# Patient Record
Sex: Male | Born: 1990 | Race: Black or African American | Hispanic: No | Marital: Single | Smoking: Current every day smoker
Health system: Southern US, Community
[De-identification: ages and names within clinical notes are randomized; demographics above are authoritative.]

## PROBLEM LIST (undated history)

## (undated) DIAGNOSIS — B009 Herpesviral infection, unspecified: Secondary | ICD-10-CM

## (undated) HISTORY — PX: TONSILLECTOMY: SUR1361

---

## 2010-12-16 ENCOUNTER — Emergency Department (HOSPITAL_COMMUNITY): Payer: Medicaid Other

## 2010-12-16 ENCOUNTER — Emergency Department (HOSPITAL_COMMUNITY)
Admission: EM | Admit: 2010-12-16 | Discharge: 2010-12-17 | Disposition: A | Discharge status: Home / Self Care | Payer: Medicaid Other | Attending: Emergency Medicine | Admitting: Emergency Medicine

## 2010-12-16 DIAGNOSIS — Y9367 Activity, basketball: Secondary | ICD-10-CM | POA: Insufficient documentation

## 2010-12-16 DIAGNOSIS — S93409A Sprain of unspecified ligament of unspecified ankle, initial encounter: Secondary | ICD-10-CM | POA: Insufficient documentation

## 2010-12-16 DIAGNOSIS — X500XXA Overexertion from strenuous movement or load, initial encounter: Secondary | ICD-10-CM | POA: Insufficient documentation

## 2011-12-12 HISTORY — PX: APPENDECTOMY: SHX54

## 2012-01-11 ENCOUNTER — Encounter (HOSPITAL_COMMUNITY): Payer: Self-pay | Admitting: Emergency Medicine

## 2012-01-11 ENCOUNTER — Observation Stay (HOSPITAL_COMMUNITY)
Admission: EM | Admit: 2012-01-11 | Discharge: 2012-01-12 | Disposition: A | Discharge status: Home / Self Care | Payer: MEDICAID | Attending: General Surgery | Admitting: General Surgery

## 2012-01-11 DIAGNOSIS — K3580 Unspecified acute appendicitis: Secondary | ICD-10-CM

## 2012-01-11 DIAGNOSIS — R1031 Right lower quadrant pain: Principal | ICD-10-CM | POA: Insufficient documentation

## 2012-01-11 LAB — CBC WITH DIFFERENTIAL/PLATELET
Basophils Relative: 0 % (ref 0–1)
Hemoglobin: 16.4 g/dL (ref 13.0–17.0)
MCHC: 35 g/dL (ref 30.0–36.0)
Monocytes Relative: 8 % (ref 3–12)
Neutro Abs: 7.8 10*3/uL — ABNORMAL HIGH (ref 1.7–7.7)
Neutrophils Relative %: 68 % (ref 43–77)
Platelets: 299 10*3/uL (ref 150–400)
RBC: 5.38 MIL/uL (ref 4.22–5.81)

## 2012-01-11 LAB — URINALYSIS, ROUTINE W REFLEX MICROSCOPIC
Leukocytes, UA: NEGATIVE
Nitrite: NEGATIVE
Specific Gravity, Urine: 1.026 (ref 1.005–1.030)
Urobilinogen, UA: 1 mg/dL (ref 0.0–1.0)
pH: 7 (ref 5.0–8.0)

## 2012-01-11 LAB — BASIC METABOLIC PANEL
BUN: 11 mg/dL (ref 6–23)
Chloride: 101 mEq/L (ref 96–112)
GFR calc Af Amer: >90 mL/min (ref >90)
Potassium: 3.4 mEq/L — ABNORMAL LOW (ref 3.5–5.1)
Sodium: 142 mEq/L (ref 135–145)

## 2012-01-11 MED ORDER — MORPHINE SULFATE 4 MG/ML IJ SOLN
4.0000 mg | Freq: Once | INTRAMUSCULAR | Status: AC
  Administered 2012-01-11: 4 mg via INTRAVENOUS
  Filled 2012-01-11: qty 1

## 2012-01-11 MED ORDER — IOHEXOL 300 MG/ML  SOLN
20.0000 mL | INTRAMUSCULAR | Status: DC
  Administered 2012-01-11: 20 mL via ORAL

## 2012-01-11 MED ORDER — ONDANSETRON HCL 4 MG/2ML IJ SOLN
4.0000 mg | Freq: Once | INTRAMUSCULAR | Status: AC
  Administered 2012-01-11: 4 mg via INTRAVENOUS
  Filled 2012-01-11: qty 2

## 2012-01-11 NOTE — ED Notes (Signed)
Pt drinking contrast no complaints at this time

## 2012-01-11 NOTE — ED Notes (Signed)
PT. REPORTS VOMITTING AND DIARRHEA WITH MID ABDOMINAL PAIN ONSETT HIS MORNING , DENIES FEVER OR CHILLS.

## 2012-01-11 NOTE — ED Provider Notes (Signed)
History     CSN: [id number]  Arrival date & time 01/11/12  4098   First MD Initiated Contact with Patient 01/11/12 2129      Chief Complaint  Patient presents with  . Abdominal Pain    (Consider location/radiation/quality/duration/timing/severity/associated sxs/prior treatment) HPI Comments: Patient presents today with a chief complaint of abdominal pain.  He reports that he began having periumbilical pain this morning.  The pain has since migrated to the RLQ and is becoming progressively worse.  He describes the pain as an intense cramping pain.  He reports that the pain became worse when he was wearing his seatbelt because it touched his RLQ.  Walking also makes the pain slightly worse.  He has not taken anything for pain.  At this time he is having localized pain to the RLQ.  Pain associated with nausea, vomiting, and diarrhea.  No blood in his emesis or blood in his stool.  He denies fever or chills.  He has also had a decreased appetite.  No prior abdominal surgeries.  He has never had pain like this before.    Patient is a 21 y.o. male presenting with abdominal pain. The history is provided by the patient.  Abdominal Pain The primary symptoms of the illness include abdominal pain, nausea, vomiting and diarrhea. The primary symptoms of the illness do not include fever, shortness of breath, hematemesis, hematochezia or dysuria.  Symptoms associated with the illness do not include chills, constipation or hematuria.    History reviewed. No pertinent past medical history.  History reviewed. No pertinent past surgical history.  No family history on file.  History  Substance Use Topics  . Smoking status: Never Smoker   . Smokeless tobacco: Not on file  . Alcohol Use: Yes      Review of Systems  Constitutional: Positive for appetite change. Negative for fever and chills.  Respiratory: Negative for shortness of breath.   Gastrointestinal: Positive for nausea, vomiting, abdominal  pain and diarrhea. Negative for constipation, blood in stool, hematochezia, abdominal distention and hematemesis.  Genitourinary: Negative for dysuria, hematuria, flank pain, scrotal swelling and penile pain.  Neurological: Negative for dizziness, syncope and light-headedness.    Allergies  Review of patient's allergies indicates no known allergies.  Home Medications  No current outpatient prescriptions on file.  BP 128/84  Pulse 77  Temp 98.3 F (36.8 C) (Oral)  Resp 16  SpO2 100%  Physical Exam  Nursing note and vitals reviewed. Constitutional: He appears well-developed and well-nourished. No distress.  HENT:  Head: Normocephalic and atraumatic.  Mouth/Throat: Oropharynx is clear and moist.  Neck: Normal range of motion. Neck supple.  Cardiovascular: Normal rate, regular rhythm and normal heart sounds.   Pulmonary/Chest: Effort normal and breath sounds normal.  Abdominal: Soft. Bowel sounds are normal. He exhibits no distension and no mass. There is tenderness. There is rebound and guarding.       Tenderness to palpation of the RLQ with rebound and guarding Negative Rovsing's Negative Obturator and Psoas  Musculoskeletal: Normal range of motion.  Neurological: He is alert.  Skin: Skin is warm and dry. He is not diaphoretic.  Psychiatric: He has a normal mood and affect.    ED Course  Procedures (including critical care time)  Labs Reviewed  CBC WITH DIFFERENTIAL - Abnormal; Notable for the following:    WBC 11.5 (*)     Neutro Abs 7.8 (*)     All other components within normal limits  BASIC METABOLIC PANEL -  Abnormal; Notable for the following:    Potassium 3.4 (*)     All other components within normal limits  URINALYSIS, ROUTINE W REFLEX MICROSCOPIC - Abnormal; Notable for the following:    Color, Urine AMBER (*)  BIOCHEMICALS MAY BE AFFECTED BY COLOR   Ketones, ur 40 (*)     All other components within normal limits   No results found.   No diagnosis  found.  12:30  Patient signed out to Dr. Jeraldine Loots who assumes care of patient.  CT ab/pelvis pending.  MDM  Patient presenting with periumbilical pain that had migrated to his RLQ.    Pain localized to RLQ on exam with rebound and guarding.  Pain associated with nausea, vomiting, and diarrhea.  No fevers or chills.  WBC mildly elevated.  Therefore, CT ab/pelvis ordered to rule out Acute Appendicitis.  Dr. Jeraldine Loots will follow up on results.        Pascal Lux [place], PA-C 01/12/12 0122

## 2012-01-12 ENCOUNTER — Emergency Department (HOSPITAL_COMMUNITY): Payer: Self-pay | Admitting: Certified Registered Nurse Anesthetist

## 2012-01-12 ENCOUNTER — Encounter (HOSPITAL_COMMUNITY): Admission: EM | Disposition: A | Discharge status: Home / Self Care | Payer: Self-pay | Source: Home / Self Care

## 2012-01-12 ENCOUNTER — Encounter (HOSPITAL_COMMUNITY): Payer: Self-pay | Admitting: *Deleted

## 2012-01-12 ENCOUNTER — Emergency Department (HOSPITAL_COMMUNITY): Payer: Self-pay

## 2012-01-12 ENCOUNTER — Encounter (HOSPITAL_COMMUNITY): Payer: Self-pay | Admitting: Certified Registered Nurse Anesthetist

## 2012-01-12 DIAGNOSIS — K3580 Unspecified acute appendicitis: Secondary | ICD-10-CM

## 2012-01-12 HISTORY — PX: LAPAROSCOPIC APPENDECTOMY: SHX408

## 2012-01-12 SURGERY — APPENDECTOMY, LAPAROSCOPIC
Anesthesia: General | Site: Abdomen | Wound class: Contaminated

## 2012-01-12 MED ORDER — IOHEXOL 300 MG/ML  SOLN
100.0000 mL | Freq: Once | INTRAMUSCULAR | Status: AC | PRN
  Administered 2012-01-12: 100 mL via INTRAVENOUS

## 2012-01-12 MED ORDER — GLYCOPYRROLATE 0.2 MG/ML IJ SOLN
INTRAMUSCULAR | Status: DC | PRN
  Administered 2012-01-12: .7 mg via INTRAVENOUS

## 2012-01-12 MED ORDER — ONDANSETRON HCL 4 MG/2ML IJ SOLN: 4.0000 mg | Freq: Four times a day (QID) | INTRAMUSCULAR | Status: DC | PRN

## 2012-01-12 MED ORDER — MEPERIDINE HCL 25 MG/ML IJ SOLN: 6.2500 mg | INTRAMUSCULAR | Status: DC | PRN

## 2012-01-12 MED ORDER — PROMETHAZINE HCL 25 MG/ML IJ SOLN: 6.2500 mg | INTRAMUSCULAR | Status: DC | PRN

## 2012-01-12 MED ORDER — ENOXAPARIN SODIUM 40 MG/0.4ML ~~LOC~~ SOLN
40.0000 mg | Freq: Every day | SUBCUTANEOUS | Status: DC
  Filled 2012-01-12: qty 0.4

## 2012-01-12 MED ORDER — ACETAMINOPHEN 10 MG/ML IV SOLN
INTRAVENOUS | Status: DC | PRN
  Administered 2012-01-12: 1000 mg via INTRAVENOUS

## 2012-01-12 MED ORDER — PROPOFOL 10 MG/ML IV EMUL
INTRAVENOUS | Status: DC | PRN
  Administered 2012-01-12: 200 mg via INTRAVENOUS

## 2012-01-12 MED ORDER — ONDANSETRON HCL 4 MG PO TABS: 4.0000 mg | ORAL_TABLET | Freq: Four times a day (QID) | ORAL | Status: DC | PRN

## 2012-01-12 MED ORDER — BUPIVACAINE-EPINEPHRINE 0.5% -1:200000 IJ SOLN
INTRAMUSCULAR | Status: DC | PRN
  Administered 2012-01-12: 30 mL

## 2012-01-12 MED ORDER — OXYCODONE-ACETAMINOPHEN 5-325 MG PO TABS
1.0000 | ORAL_TABLET | ORAL | Status: DC | PRN
Start: 2012-01-12 — End: 2012-01-12

## 2012-01-12 MED ORDER — LIDOCAINE HCL (CARDIAC) 20 MG/ML IV SOLN
INTRAVENOUS | Status: DC | PRN
  Administered 2012-01-12: 80 mg via INTRAVENOUS

## 2012-01-12 MED ORDER — LACTATED RINGERS IV SOLN
INTRAVENOUS | Status: DC | PRN
  Administered 2012-01-12 (×2): via INTRAVENOUS

## 2012-01-12 MED ORDER — HYDROMORPHONE HCL PF 1 MG/ML IJ SOLN: 0.2500 mg | INTRAMUSCULAR | Status: DC | PRN

## 2012-01-12 MED ORDER — DEXTROSE 5 % IV SOLN
2.0000 g | INTRAVENOUS | Status: DC
  Filled 2012-01-12: qty 2

## 2012-01-12 MED ORDER — POTASSIUM CHLORIDE IN NACL 20-0.9 MEQ/L-% IV SOLN
INTRAVENOUS | Status: DC
  Administered 2012-01-12: 06:00:00 via INTRAVENOUS
  Filled 2012-01-12 (×2): qty 1000

## 2012-01-12 MED ORDER — ONDANSETRON HCL 4 MG/2ML IJ SOLN
INTRAMUSCULAR | Status: DC | PRN
  Administered 2012-01-12: 100 mg via INTRAVENOUS

## 2012-01-12 MED ORDER — SODIUM CHLORIDE 0.9 % IR SOLN
Status: DC | PRN
  Administered 2012-01-12: 1

## 2012-01-12 MED ORDER — ROCURONIUM BROMIDE 100 MG/10ML IV SOLN
INTRAVENOUS | Status: DC | PRN
  Administered 2012-01-12: 25 mg via INTRAVENOUS

## 2012-01-12 MED ORDER — OXYCODONE-ACETAMINOPHEN 5-325 MG PO TABS: 1.0000 | ORAL_TABLET | ORAL | Status: AC | PRN

## 2012-01-12 MED ORDER — HYDROMORPHONE HCL PF 1 MG/ML IJ SOLN: 1.0000 mg | INTRAMUSCULAR | Status: DC | PRN

## 2012-01-12 MED ORDER — KETOROLAC TROMETHAMINE 30 MG/ML IM SOLN
INTRAMUSCULAR | Status: DC | PRN
  Administered 2012-01-12: 30 mg via INTRAMUSCULAR

## 2012-01-12 MED ORDER — MIDAZOLAM HCL 5 MG/5ML IJ SOLN
INTRAMUSCULAR | Status: DC | PRN
  Administered 2012-01-12: 2 mg via INTRAVENOUS

## 2012-01-12 MED ORDER — NEOSTIGMINE METHYLSULFATE 1 MG/ML IJ SOLN
INTRAMUSCULAR | Status: DC | PRN
  Administered 2012-01-12: 4 mg via INTRAVENOUS

## 2012-01-12 MED ORDER — FENTANYL CITRATE 0.05 MG/ML IJ SOLN
INTRAMUSCULAR | Status: DC | PRN
  Administered 2012-01-12: 50 ug via INTRAVENOUS
  Administered 2012-01-12: 100 ug via INTRAVENOUS
  Administered 2012-01-12 (×2): 50 ug via INTRAVENOUS

## 2012-01-12 MED ORDER — DEXTROSE 5 % IV SOLN
1.0000 g | INTRAVENOUS | Status: DC | PRN
  Administered 2012-01-12: 2 g via INTRAVENOUS

## 2012-01-12 MED ORDER — IBUPROFEN 600 MG PO TABS
600.0000 mg | ORAL_TABLET | Freq: Four times a day (QID) | ORAL | Status: DC | PRN
  Filled 2012-01-12: qty 1

## 2012-01-12 SURGICAL SUPPLY — 41 items
APPLIER CLIP LOGIC TI 5 (MISCELLANEOUS) IMPLANT
APPLIER CLIP ROT 10 11.4 M/L (STAPLE)
BANDAGE ADHESIVE 1X3 (GAUZE/BANDAGES/DRESSINGS) ×2 IMPLANT
BENZOIN TINCTURE PRP APPL 2/3 (GAUZE/BANDAGES/DRESSINGS) ×2 IMPLANT
CANISTER SUCTION 2500CC (MISCELLANEOUS) ×2 IMPLANT
CHLORAPREP W/TINT 26ML (MISCELLANEOUS) ×2 IMPLANT
CLIP APPLIE ROT 10 11.4 M/L (STAPLE) IMPLANT
CLOTH BEACON ORANGE TIMEOUT ST (SAFETY) ×2 IMPLANT
COVER SURGICAL LIGHT HANDLE (MISCELLANEOUS) ×2 IMPLANT
CUTTER LINEAR ENDO 35 ETS (STAPLE) ×2 IMPLANT
CUTTER LINEAR ENDO 35 ETS TH (STAPLE) IMPLANT
DECANTER SPIKE VIAL GLASS SM (MISCELLANEOUS) IMPLANT
ELECT REM PT RETURN 9FT ADLT (ELECTROSURGICAL) ×2
ELECTRODE REM PT RTRN 9FT ADLT (ELECTROSURGICAL) ×1 IMPLANT
ENDOLOOP SUT PDS II  0 18 (SUTURE)
ENDOLOOP SUT PDS II 0 18 (SUTURE) IMPLANT
GLOVE BIO SURGEON STRL SZ7.5 (GLOVE) ×2 IMPLANT
GLOVE BIOGEL PI IND STRL 7.5 (GLOVE) ×1 IMPLANT
GLOVE BIOGEL PI INDICATOR 7.5 (GLOVE) ×1
GLOVE SURG SIGNA 7.5 PF LTX (GLOVE) ×2 IMPLANT
GLOVE SURG SS PI 7.5 STRL IVOR (GLOVE) ×2 IMPLANT
GOWN PREVENTION PLUS XLARGE (GOWN DISPOSABLE) ×2 IMPLANT
GOWN STRL NON-REIN LRG LVL3 (GOWN DISPOSABLE) ×2 IMPLANT
KIT BASIN OR (CUSTOM PROCEDURE TRAY) ×2 IMPLANT
KIT ROOM TURNOVER OR (KITS) ×2 IMPLANT
NS IRRIG 1000ML POUR BTL (IV SOLUTION) ×2 IMPLANT
PAD ARMBOARD 7.5X6 YLW CONV (MISCELLANEOUS) ×4 IMPLANT
POUCH SPECIMEN RETRIEVAL 10MM (ENDOMECHANICALS) ×2 IMPLANT
RELOAD /EVU35 (ENDOMECHANICALS) IMPLANT
RELOAD CUTTER ETS 35MM STAND (ENDOMECHANICALS) IMPLANT
SCALPEL HARMONIC ACE (MISCELLANEOUS) ×2 IMPLANT
SET IRRIG TUBING LAPAROSCOPIC (IRRIGATION / IRRIGATOR) ×2 IMPLANT
SLEEVE ENDOPATH XCEL 5M (ENDOMECHANICALS) ×2 IMPLANT
SPECIMEN JAR SMALL (MISCELLANEOUS) ×2 IMPLANT
SUT MON AB 4-0 PC3 18 (SUTURE) ×2 IMPLANT
TOWEL OR 17X24 6PK STRL BLUE (TOWEL DISPOSABLE) ×2 IMPLANT
TOWEL OR 17X26 10 PK STRL BLUE (TOWEL DISPOSABLE) ×2 IMPLANT
TRAY LAPAROSCOPIC (CUSTOM PROCEDURE TRAY) ×2 IMPLANT
TROCAR XCEL BLUNT TIP 100MML (ENDOMECHANICALS) ×2 IMPLANT
TROCAR XCEL NON-BLD 5MMX100MML (ENDOMECHANICALS) ×2 IMPLANT
WATER STERILE IRR 1000ML POUR (IV SOLUTION) IMPLANT

## 2012-01-12 NOTE — ED Notes (Signed)
Pt to or from [facility]

## 2012-01-12 NOTE — Anesthesia Preprocedure Evaluation (Addendum)
Anesthesia Evaluation  Patient identified by MRN, date of birth, ID band Patient awake    Reviewed: Allergy & Precautions, H&P , NPO status   Airway Mallampati: I  Neck ROM: full    Dental  (+) Dental Advisory Given and Teeth Intact   Pulmonary neg pulmonary ROS, Current Smoker,          Cardiovascular negative cardio ROS  Rhythm:regular Rate:Normal     Neuro/Psych    GI/Hepatic negative GI ROS, Neg liver ROS,   Endo/Other  negative endocrine ROS  Renal/GU negative Renal ROS     Musculoskeletal   Abdominal   Peds  Hematology negative hematology ROS (+)   Anesthesia Other Findings   Reproductive/Obstetrics                         Anesthesia Physical Anesthesia Plan  ASA: I and Emergent  Anesthesia Plan: General ETT   Post-op Pain Management:    Induction: Intravenous, Rapid sequence and Cricoid pressure planned  Airway Management Planned: Oral ETT  Additional Equipment:   Intra-op Plan:   Post-operative Plan: Extubation in OR  Informed Consent: I have reviewed the patients History and Physical, chart, labs and discussed the procedure including the risks, benefits and alternatives for the proposed anesthesia with the patient or authorized representative who has indicated his/her understanding and acceptance.   Dental Advisory Given and Dental advisory given  Plan Discussed with: Surgeon  Anesthesia Plan Comments:        Anesthesia Quick Evaluation

## 2012-01-12 NOTE — ED Notes (Signed)
Pt ambulates to bathroom

## 2012-01-12 NOTE — H&P (Signed)
Larry Harper is an 21 y.o. male.   Chief Complaint: right lower quadrant abdominal pain HPI: this is a 21 year old gentleman with a less than 24-hour history of right lower quadrant abdominal pain. He had one episode of emesis with this. He has also had some nausea. The pain is sharp, constant, and moderate in intensity. It does not refer anywhere else. He denies fevers or chills. He is otherwise without complaints.  History reviewed. No pertinent past medical history.  Past surgical history: Tonsillectomy Family history: Unremarkable Social History:  reports that he has never smoked. He does not have any smokeless tobacco history on file. He reports that he drinks alcohol. He reports that he does not use illicit drugs.  Allergies: No Known Allergies   (Not in a hospital admission)  Results for orders placed during the hospital encounter of 01/11/12 (from the past 48 hour(s))  CBC WITH DIFFERENTIAL     Status: Abnormal   Collection Time   01/11/12  7:48 PM      Component Value Range Comment   WBC 11.5 (*) 4.0 - 10.5 K/uL    RBC 5.38  4.22 - 5.81 MIL/uL    Hemoglobin 16.4  13.0 - 17.0 g/dL    HCT 40.9  81.1 - 91.4 %    MCV 87.0  78.0 - 100.0 fL    MCH 30.5  26.0 - 34.0 pg    MCHC 35.0  30.0 - 36.0 g/dL    RDW 78.2  95.6 - 21.3 %    Platelets 299  150 - 400 K/uL    Neutrophils Relative 68  43 - 77 %    Neutro Abs 7.8 (*) 1.7 - 7.7 K/uL    Lymphocytes Relative 23  12 - 46 %    Lymphs Abs 2.7  0.7 - 4.0 K/uL    Monocytes Relative 8  3 - 12 %    Monocytes Absolute 0.9  0.1 - 1.0 K/uL    Eosinophils Relative 1  0 - 5 %    Eosinophils Absolute 0.1  0.0 - 0.7 K/uL    Basophils Relative 0  0 - 1 %    Basophils Absolute 0.0  0.0 - 0.1 K/uL   BASIC METABOLIC PANEL     Status: Abnormal   Collection Time   01/11/12  7:48 PM      Component Value Range Comment   Sodium 142  135 - 145 mEq/L    Potassium 3.4 (*) 3.5 - 5.1 mEq/L    Chloride 101  96 - 112 mEq/L    CO2 30  19 - 32 mEq/L    Glucose, Bld 84  70 - 99 mg/dL    BUN 11  6 - 23 mg/dL    Creatinine, Ser 0.86  0.50 - 1.35 mg/dL    Calcium 9.5  8.4 - 57.8 mg/dL    GFR calc non Af Amer >90  >90 mL/min    GFR calc Af Amer >90  >90 mL/min   URINALYSIS, ROUTINE W REFLEX MICROSCOPIC     Status: Abnormal   Collection Time   01/11/12  7:51 PM      Component Value Range Comment   Color, Urine AMBER (*) YELLOW BIOCHEMICALS MAY BE AFFECTED BY COLOR   APPearance CLEAR  CLEAR    Specific Gravity, Urine 1.026  1.005 - 1.030    pH 7.0  5.0 - 8.0    Glucose, UA NEGATIVE  NEGATIVE mg/dL    Hgb urine dipstick NEGATIVE  NEGATIVE    Bilirubin Urine NEGATIVE  NEGATIVE    Ketones, ur 40 (*) NEGATIVE mg/dL    Protein, ur NEGATIVE  NEGATIVE mg/dL    Urobilinogen, UA 1.0  0.0 - 1.0 mg/dL    Nitrite NEGATIVE  NEGATIVE    Leukocytes, UA NEGATIVE  NEGATIVE MICROSCOPIC NOT DONE ON URINES WITH NEGATIVE PROTEIN, BLOOD, LEUKOCYTES, NITRITE, OR GLUCOSE <1000 mg/dL.   Ct Abdomen Pelvis W Contrast  01/12/2012  *RADIOLOGY REPORT*  Clinical Data: Right-sided abdominal pain  CT ABDOMEN AND PELVIS WITH CONTRAST  Technique:  Multidetector CT imaging of the abdomen and pelvis was performed following the standard protocol during bolus administration of intravenous contrast.  Contrast: OMNIPAQUE IOHEXOL 300 MG/ML  SOLN  Comparison: None.  Findings: Minimal subsegmental dependent atelectasis.  Otherwise lung bases are clear.  Normal heart size.  No pleural or pericardial effusion.  Unremarkable liver, biliary system, spleen, pancreas, adrenal glands, and kidneys.  No hydronephrosis or hydroureter.  The appendix measures 9 mm in diameter.  There is mucosal hyperenhancement and periappendiceal fat stranding.  No free intraperitoneal air or loculated fluid.  There is a small amount of dependent fluid within the pelvis.  No bowel obstruction.  No CT evidence for colitis.  No lymphadenopathy.  Reactive ileocolic lymph nodes.  Normal caliber vasculature.  Partially  decompressed, thin-walled bladder.  No acute osseous finding.  IMPRESSION: Acute appendicitis.  Original Report Authenticated By: Waneta Martins, M.D.    Review of Systems  Constitutional: Negative.   HENT: Negative.   Eyes: Negative.   Respiratory: Negative.   Cardiovascular: Negative.   Gastrointestinal: Positive for nausea, vomiting and abdominal pain.  Genitourinary: Negative.   Musculoskeletal: Negative.   Skin: Negative.   Neurological: Negative.   Endo/Heme/Allergies: Negative.   Psychiatric/Behavioral: Negative.     Blood pressure 121/83, pulse 70, temperature 98.3 F (36.8 C), temperature source Oral, resp. rate 16, SpO2 98.00%. Physical Exam  Constitutional: He is oriented to person, place, and time. He appears well-developed and well-nourished. No distress.  HENT:  Head: Normocephalic and atraumatic.  Right Ear: External ear normal.  Left Ear: External ear normal.  Nose: Nose normal.  Mouth/Throat: Oropharynx is clear and moist. No oropharyngeal exudate.  Eyes: Conjunctivae are normal. Pupils are equal, round, and reactive to light. Right eye exhibits no discharge. Left eye exhibits no discharge. No scleral icterus.  Neck: Normal range of motion. Neck supple. No tracheal deviation present. No thyromegaly present.  Cardiovascular: Normal rate, regular rhythm, normal heart sounds and intact distal pulses.   No murmur heard. Respiratory: Effort normal and breath sounds normal. No respiratory distress. He has no wheezes.  GI: Soft. Bowel sounds are normal. There is tenderness. There is guarding.       There is mild tenderness with guarding in the right lower quadrant  Musculoskeletal: Normal range of motion. He exhibits no edema and no tenderness.  Lymphadenopathy:    He has no cervical adenopathy.  Neurological: He is alert and oriented to person, place, and time. No cranial nerve deficit. Coordination normal.  Skin: Skin is warm and dry. No rash noted. He is not  diaphoretic. No erythema.  Psychiatric: His behavior is normal. Judgment normal.     Assessment/Plan Acute appendicitis  Laparoscopic appendectomy is recommended. I discussed this with him in detail. I discussed the risk of surgery which include but not limited to bleeding, infection, appendiceal stump leak, injury to surrounding structures, need to convert to an open procedure, etc. He understands  and wishes to proceed. Preoperative antibiotics will be given. Likelihood of  success is good  Kevona Lupinacci A 01/12/2012, 1:31 AM

## 2012-01-12 NOTE — Anesthesia Postprocedure Evaluation (Signed)
  Anesthesia Post-op Note  Patient: Larry Harper  Procedure(s) Performed: Procedure(s) (LRB): APPENDECTOMY LAPAROSCOPIC (N/A)  Patient Location: PACU  Anesthesia Type: General  Level of Consciousness: awake and sedated  Airway and Oxygen Therapy: Patient Spontanous Breathing  Post-op Pain: mild  Post-op Assessment: Post-op Vital signs reviewed  Post-op Vital Signs: stable  Complications: No apparent anesthesia complications

## 2012-01-12 NOTE — Discharge Summary (Signed)
  Physician Discharge Summary  Patient ID: Caetano Oberhaus MRN: [id number] DOB/AGE: 1990/11/09 20 y.o.  Admit date: 01/11/2012 Discharge date: 01/12/2012  Admitting Diagnosis: Acute Appendicitis  Discharge Diagnosis Acute Appendicitis  Consultants None  Procedures Laparoscopic Appendectomy  Hospital Course 21 yr old male who presented to [facility] with abdominal pain.  Workup showed appendicitis.  Patient was admitted and underwent procedure listed above.  Tolerated procedure well and was transferred to the floor.  Diet was advanced as tolerated.  On POD#1, the patient was voiding well, tolerating diet, ambulating well, pain well controlled, vital signs stable, incisions c/d/i and felt stable for discharge home.  Patient will follow up in our office in 2 weeks and knows to call with questions or concerns.  Medication List  As of 01/12/2012  9:08 AM   TAKE these medications         oxyCODONE-acetaminophen 5-325 MG per tablet   Commonly known as: PERCOCET/ROXICET   Take 1-2 tablets by mouth every 4 (four) hours as needed.             Follow-up Information    Follow up with [facility] A, MD. Call in 2 weeks. (Please call our office to schedule an appointment to see Dr. Magnus Ivan for a follow up.)    Contact information:   [address] [place] [address] [phone]          Signed: Denny Levy [facility] [phone]  01/12/2012, 9:08 AM

## 2012-01-12 NOTE — ED Provider Notes (Addendum)
Medical screening examination/treatment/procedure(s) were conducted as a shared visit with non-physician practitioner(s) and myself.  I personally evaluated the patient during the encounter rlq pain and anorexia started today.  + rlq ttp with rebound. Will check ct for eval of possible appy.   Cheri Guppy, MD 01/12/12 0004  Cheri Guppy, MD 01/12/12 [phone]

## 2012-01-12 NOTE — Preoperative (Signed)
Beta Blockers   Reason not to administer Beta Blockers:Not Applicable 

## 2012-01-12 NOTE — ED Provider Notes (Signed)
I assumed care of the patient, pending his CT results.  CT is consistent with acute appendicitis.  I discussed this with the surgeon on-call.  Dr. Magnus Ivan will see the patient.  Gerhard Munch, MD 01/12/12 [phone]

## 2012-01-12 NOTE — Op Note (Signed)
Appendectomy, Lap, Procedure Note  Indications: The patient presented with a history of right-sided abdominal pain. A CT revealed findings consistent with acute appendicitis.  Pre-operative Diagnosis: Acute appendicitis without mention of peritonitis  Post-operative Diagnosis: Same  Surgeon: Abigail Miyamoto A   Assistants: 0  Anesthesia: General endotracheal anesthesia  ASA Class: 1  Procedure Details  The patient was seen again in the Holding Room. The risks, benefits, complications, treatment options, and expected outcomes were discussed with the patient and/or family. The possibilities of reaction to medication, perforation of viscus, bleeding, recurrent infection, finding a normal appendix, the need for additional procedures, failure to diagnose a condition, and creating a complication requiring transfusion or operation were discussed. There was concurrence with the proposed plan and informed consent was obtained. The site of surgery was properly noted. The patient was taken to Operating Room, identified as Larry Harper and the procedure verified as Appendectomy. A Time Out was held and the above information confirmed.  The patient was placed in the supine position and general anesthesia was induced, along with placement of orogastric tube, Venodyne boots, and a Foley catheter. The abdomen was prepped and draped in a sterile fashion. A one centimeter infraumbilical incision was made.  The umbilical stalk was elevated, and the midline fascia was incised with a #11 blade.  A Kelly clamp was used to confirm entrance into the peritoneal cavity.  A pursestring suture was passed around the incision with a 0 Vicryl.  The Hasson was introduced into the abdomen and the tails of the suture were used to hold the Hasson in place.   The pneumoperitoneum was then established to steady pressure of 15 mmHg.  Additional 5 mm cannulas then placed in the left lower quadrant of the abdomen and the suprapubic  region under direct visualization. A careful evaluation of the entire abdomen was carried out. The patient was placed in Trendelenburg and left lateral decubitus position. The small intestines were retracted in the cephalad and left lateral direction away from the pelvis and right lower quadrant. The patient was found to have an enlarged and inflamed appendix that was extending into the pelvis. There was no evidence of perforation.  The appendix was carefully dissected. The appendix was was skeletonized with the harmonic scalpel.   The appendix was divided at its base using an endo-GIA stapler. Minimal appendiceal stump was left in place. There was no evidence of bleeding, leakage, or complication after division of the appendix. Irrigation was also performed and irrigate suctioned from the abdomen as well.  The umbilical port site was closed with the purse string suture. There was no residual palpable fascial defect.  The trocar site skin wounds were closed with 4-0 Monocryl.  Instrument, sponge, and needle counts were correct at the conclusion of the case.   Findings: The appendix showed early appendicitis without evidence of perforation  Estimated Blood Loss:  Minimal                Complications:  None; patient tolerated the procedure well.         Disposition: PACU - hemodynamically stable.         Condition: stable

## 2012-01-12 NOTE — Transfer of Care (Signed)
Immediate Anesthesia Transfer of Care Note  Patient: Larry Harper  Procedure(s) Performed: Procedure(s) (LRB): APPENDECTOMY LAPAROSCOPIC (N/A)  Patient Location: PACU  Anesthesia Type: General  Level of Consciousness: awake, alert  and oriented  Airway & Oxygen Therapy: Patient Spontanous Breathing and Patient connected to nasal cannula oxygen  Post-op Assessment: Report given to PACU RN and Post -op Vital signs reviewed and stable  Post vital signs: Reviewed and stable  Complications: No apparent anesthesia complications

## 2012-01-12 NOTE — ED Notes (Signed)
Dr Rayburn Ma at bedside pt informed of plan of care

## 2012-01-12 NOTE — Discharge Instructions (Signed)
[facility] LAPAROSCOPIC SURGERY: POST OP INSTRUCTIONS Always review your discharge instruction sheet given to you by the facility where your surgery was performed. IF YOU HAVE DISABILITY OR FAMILY LEAVE FORMS, YOU MUST BRING THEM TO THE OFFICE FOR PROCESSING.   DO NOT GIVE THEM TO YOUR DOCTOR.  1. A prescription for pain medication may be given to you upon discharge.  Take your pain medication as prescribed, if needed.  If narcotic pain medicine is not needed, then you may take acetaminophen (Tylenol) or ibuprofen (Advil) as needed. 2. Take your usually prescribed medications unless otherwise directed. 3. If you need a refill on your pain medication, please contact your pharmacy.  They will contact our office to request authorization. Prescriptions will not be filled after 5pm or on week-ends. 4. You should follow a light diet the first few days after arrival home, such as soup and crackers, etc.  Be sure to include lots of fluids daily. 5. Most patients will experience some swelling and bruising in the area of the incisions.  Ice packs will help.  Swelling and bruising can take several days to resolve.  6. It is common to experience some constipation if taking pain medication after surgery.  Increasing fluid intake and taking a stool softener (such as Colace) will usually help or prevent this problem from occurring.  A mild laxative (Milk of Magnesia or Miralax) should be taken according to package instructions if there are no bowel movements after 48 hours. 7. Unless discharge instructions indicate otherwise, you may remove your bandages 24-48 hours after surgery, and you may shower at that time.  You may have steri-strips (small skin tapes) in place directly over the incision.  These strips should be left on the skin for 7-10 days.  If your surgeon used skin glue on the incision, you may shower in 24 hours.  The glue will flake off over the next 2-3 weeks.  Any sutures or  staples will be removed at the office during your follow-up visit. 8. ACTIVITIES:  You may resume regular (light) daily activities beginning the next day--such as daily self-care, walking, climbing stairs--gradually increasing activities as tolerated.  You may have sexual intercourse when it is comfortable.  Refrain from any heavy lifting or straining until approved by your doctor. a. You may drive when you are no longer taking prescription pain medication, you can comfortably wear a seatbelt, and you can safely maneuver your car and apply brakes. b. RETURN TO WORK:  _____1 week for light duty(no lifting more then 10-15 pounds), 2 weeks for full duty_ 9. You should see your doctor in the office for a follow-up appointment approximately 2-3 weeks after your surgery.  Make sure that you call for this appointment within a day or two after you arrive home to insure a convenient appointment time. 10. OTHER INSTRUCTIONS: __________________________________________________________________________________________________________________________ __________________________________________________________________________________________________________________________ WHEN TO CALL YOUR DOCTOR: 1. Fever over 101.0 2. Inability to urinate 3. Continued bleeding from incision. 4. Increased pain, redness, or drainage from the incision. 5. Increasing abdominal pain  The clinic staff is available to answer your questions during regular business hours.  Please don't hesitate to call and ask to speak to one of the nurses for clinical concerns.  If you have a medical emergency, go to the nearest emergency room or call 911.  A surgeon from [facility] is always on call at the hospital. [address], [address], [place], [place]  [address] ? [id number], [place],  [place]   [address] [phone] ? [phone] ? FAX [phone] Web site: [email or web address]

## 2012-01-12 NOTE — Anesthesia Procedure Notes (Signed)
Procedure Name: Intubation Date/Time: 01/12/2012 3:07 AM Performed by: Julianne Rice Z Pre-anesthesia Checklist: Patient identified, Timeout performed, Emergency Drugs available, Suction available and Patient being monitored Patient Re-evaluated:Patient Re-evaluated prior to inductionOxygen Delivery Method: Circle system utilized Preoxygenation: Pre-oxygenation with 100% oxygen Intubation Type: IV induction and Rapid sequence Laryngoscope Size: Mac and 3 Grade View: Grade I Tube type: Oral Tube size: 7.5 mm Number of attempts: 1 Airway Equipment and Method: Stylet Placement Confirmation: ETT inserted through vocal cords under direct vision,  breath sounds checked- equal and bilateral and positive ETCO2 Secured at: 22 cm Tube secured with: Tape Dental Injury: Teeth and Oropharynx as per pre-operative assessment

## 2012-01-13 ENCOUNTER — Encounter (HOSPITAL_COMMUNITY): Payer: Self-pay | Admitting: Surgery

## 2012-01-13 NOTE — ED Provider Notes (Signed)
Medical screening examination/treatment/procedure(s) were performed by non-physician practitioner and as supervising physician I was immediately available for consultation/collaboration.  Cheri Guppy, MD 01/13/12 [phone]

## 2012-01-16 MED FILL — Ketorolac Tromethamine Inj 30 MG/ML: INTRAMUSCULAR | Qty: 1 | Status: AC

## 2012-02-01 ENCOUNTER — Encounter (INDEPENDENT_AMBULATORY_CARE_PROVIDER_SITE_OTHER): Payer: Self-pay | Admitting: Surgery

## 2012-02-01 ENCOUNTER — Ambulatory Visit (INDEPENDENT_AMBULATORY_CARE_PROVIDER_SITE_OTHER): Payer: BC Managed Care – PPO | Admitting: Surgery

## 2012-02-01 VITALS — BP 130/98 | HR 80 | Temp 98.1°F | Ht 69.0 in | Wt 195.8 lb

## 2012-02-01 DIAGNOSIS — Z9049 Acquired absence of other specified parts of digestive tract: Secondary | ICD-10-CM | POA: Insufficient documentation

## 2012-02-01 DIAGNOSIS — Z9889 Other specified postprocedural states: Secondary | ICD-10-CM

## 2012-02-01 DIAGNOSIS — Z09 Encounter for follow-up examination after completed treatment for conditions other than malignant neoplasm: Secondary | ICD-10-CM

## 2012-02-01 NOTE — Progress Notes (Signed)
Subjective:     Patient ID: Larry Harper, male   DOB: 20-Dec-1990, 21 y.o.   MRN: [id number]  HPI  He is here for his first postop visit status post lap appendectomy. He is doing well has no complaints. He denies fevers Review of Systems     Objective:   Physical Exam    On exam, his abdomen is soft and his incision is healing well. His pathology shows findings consistent with acute appendicitis Assessment:     Patient status post laparoscopic appendectomy    Plan:     He may return to work and school. He may return to full activity. I will see him back as needed

## 2012-11-29 ENCOUNTER — Encounter (HOSPITAL_COMMUNITY): Payer: Self-pay | Admitting: *Deleted

## 2012-11-29 ENCOUNTER — Emergency Department (HOSPITAL_COMMUNITY)
Admission: EM | Admit: 2012-11-29 | Discharge: 2012-11-30 | Disposition: A | Discharge status: Home / Self Care | Payer: Self-pay | Attending: Emergency Medicine | Admitting: Emergency Medicine

## 2012-11-29 DIAGNOSIS — L709 Acne, unspecified: Secondary | ICD-10-CM

## 2012-11-29 DIAGNOSIS — Z8619 Personal history of other infectious and parasitic diseases: Secondary | ICD-10-CM | POA: Insufficient documentation

## 2012-11-29 DIAGNOSIS — L708 Other acne: Secondary | ICD-10-CM | POA: Insufficient documentation

## 2012-11-29 DIAGNOSIS — F17200 Nicotine dependence, unspecified, uncomplicated: Secondary | ICD-10-CM | POA: Insufficient documentation

## 2012-11-29 HISTORY — DX: Herpesviral infection, unspecified: B00.9

## 2012-11-29 NOTE — ED Notes (Signed)
Pt reports that he has been 'breaking out' on his forehead x 2 days.  Pt noted to have a large knot on his forehead.  No drainage noted.  Pt also reports 'not feeling well'  Pt very vague in triage.  Ambulatory.  A/O x 4.

## 2012-11-30 MED ORDER — BENZOYL PEROXIDE 5 % EX CREA: TOPICAL_CREAM | Freq: Every day | CUTANEOUS | Status: AC

## 2012-11-30 MED ORDER — CLINDAMYCIN PHOS-BENZOYL PEROX 1-5 % EX GEL: Freq: Two times a day (BID) | CUTANEOUS | Status: DC

## 2012-11-30 NOTE — ED Provider Notes (Signed)
History     CSN: [id number]  Arrival date & time 11/29/12  2123   None     Chief Complaint  Patient presents with  . Rash    (Consider location/radiation/quality/duration/timing/severity/associated sxs/prior treatment) HPI History provided by pt.   Pt developed a painful lesion on his upper lip 1 month ago.  It has been stable ever since.  Over the past week, he has developed several, similar lesions across his forehead.  The larger ones are ttp.  No associated fever, sore throat, cough, N/V/D, GU sx.  H/o herpes but is no longer taking Valtrex.  No h/o acne and has been taking lysine w/out relief.  No known allergies or new contacts.  Has been under a lot of stress recently.   Past Medical History  Diagnosis Date  . HSV-1 (herpes simplex virus 1) infection     Past Surgical History  Procedure Laterality Date  . Tonsillectomy    . Laparoscopic appendectomy  01/12/2012    Procedure: APPENDECTOMY LAPAROSCOPIC;  Surgeon: Shelly Rubenstein, MD;  Location: [facility] OR;  Service: General;  Laterality: N/A;  . Appendectomy  12/2011    History reviewed. No pertinent family history.  History  Substance Use Topics  . Smoking status: Current Every Day Smoker -- 3 years    Types: Cigars  . Smokeless tobacco: Not on file  . Alcohol Use: 1.2 oz/week    2 Cans of beer per week      Review of Systems  All other systems reviewed and are negative.    Allergies  Review of patient's allergies indicates no known allergies.  Home Medications   Current Outpatient Rx  Name  Route  Sig  Dispense  Refill  . benzoyl peroxide 5 % cream   Topical   Apply topically at bedtime.   113.4 g   0     BP 134/85  Pulse 79  Temp(Src) 98.3 F (36.8 C) (Oral)  Resp 20  SpO2 99%  Physical Exam  Nursing note and vitals reviewed. Constitutional: He is oriented to person, place, and time. He appears well-developed and well-nourished. No distress.  HENT:  Head: Normocephalic and atraumatic.   Eyes:  Normal appearance  Neck: Normal range of motion.  Cardiovascular: Normal rate and regular rhythm.   Pulmonary/Chest: Effort normal and breath sounds normal. No respiratory distress.  Musculoskeletal: Normal range of motion.  Neurological: He is alert and oriented to person, place, and time.  Skin: Skin is warm and dry. No rash noted.  1mm, round, skin-colored, non-tender papule on right upper lip.  Several acne lesions of forehead.    Psychiatric: He has a normal mood and affect. His behavior is normal.    ED Course  Procedures (including critical care time)  Labs Reviewed - No data to display No results found.   1. Acne       MDM  21yo M presents w/ rash; concerned it may be a herpes outbreak.  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  Pt reassured, prescribed benzoyl peroxide cream and referred to derm.           Otilio Miu, PA-C 11/30/12 [phone]

## 2012-11-30 NOTE — ED Provider Notes (Signed)
Medical screening examination/treatment/procedure(s) were performed by non-physician practitioner and as supervising physician I was immediately available for consultation/collaboration.   Joya Gaskins, MD 11/30/12 [phone]

## 2013-07-19 ENCOUNTER — Ambulatory Visit (INDEPENDENT_AMBULATORY_CARE_PROVIDER_SITE_OTHER): Payer: BC Managed Care – PPO | Admitting: Emergency Medicine

## 2013-07-19 VITALS — BP 110/68 | HR 86 | Temp 98.0°F | Resp 16 | Ht 69.25 in | Wt 198.0 lb

## 2013-07-19 DIAGNOSIS — A088 Other specified intestinal infections: Secondary | ICD-10-CM

## 2013-07-19 DIAGNOSIS — K529 Noninfective gastroenteritis and colitis, unspecified: Secondary | ICD-10-CM

## 2013-07-19 DIAGNOSIS — N451 Epididymitis: Secondary | ICD-10-CM

## 2013-07-19 DIAGNOSIS — N453 Epididymo-orchitis: Secondary | ICD-10-CM

## 2013-07-19 MED ORDER — DOXYCYCLINE HYCLATE 100 MG PO CAPS: 100.0000 mg | ORAL_CAPSULE | Freq: Two times a day (BID) | ORAL | Status: AC

## 2013-07-19 MED ORDER — ONDANSETRON 8 MG PO TBDP: 8.0000 mg | ORAL_TABLET | Freq: Three times a day (TID) | ORAL | Status: AC | PRN

## 2013-07-19 MED ORDER — LOPERAMIDE HCL 2 MG PO TABS: ORAL_TABLET | ORAL | Status: AC

## 2013-07-19 MED ORDER — CEFTRIAXONE SODIUM 1 G IJ SOLR
250.0000 mg | Freq: Once | INTRAMUSCULAR | Status: AC
  Administered 2013-07-19: 250 mg via INTRAMUSCULAR

## 2013-07-19 NOTE — Patient Instructions (Signed)
Diet The clear liquid diet consists of foods that are liquid or will become liquid at room temperature. Examples of foods allowed on a clear liquid diet include fruit juice, broth or bouillon, gelatin, or frozen ice pops. You should be able to see through the liquid. The purpose of this diet is to provide the necessary fluids, electrolytes (such as sodium and potassium), and energy to keep the body functioning during times when you are not able to consume a regular diet. A clear liquid diet should not be continued for long periods of time, as it is not nutritionally adequate.  A CLEAR LIQUID DIET MAY BE NEEDED:  When a sudden-onset (acute) condition occurs before or after surgery.   As the first step in oral feeding.   For fluid and electrolyte replacement in diarrheal diseases.   As a diet before certain medical tests are performed.  ADEQUACY The clear liquid diet is adequate only in ascorbic acid, according to the Recommended Dietary Allowances of the National Research Council.  CHOOSING FOODS Breads and Starches  Allowed: None are allowed.   Avoid: All are to be avoided.  Vegetables  Allowed: Strained vegetable juices.   Avoid: Any others.  Fruit  Allowed: Strained fruit juices and fruit drinks. Include 1 serving of citrus or vitamin C-enriched fruit juice daily.   Avoid: Any others.  Meat and Meat Substitutes  Allowed: None are allowed.   Avoid: All are to be avoided.  Milk Products  Allowed: None are allowed.   Avoid: All are to be avoided.  Soups and Combination Foods  Allowed: Clear bouillon, broth, or strained broth-based soups.   Avoid: Any others.  Desserts and Sweets  Allowed: Sugar, honey. High-protein gelatin. Flavored gelatin, ices, or frozen ice pops that do not contain milk.   Avoid: Any others.  Fats and Oils  Allowed: None are allowed.   Avoid: All are to be avoided.  Beverages  Allowed: Cereal  beverages, coffee (regular or decaffeinated), tea, or soda at the discretion of your health care provider.   Avoid: Any others.  Condiments  Allowed: Salt.   Avoid: Any others, including pepper.  Supplements  Allowed: Liquid nutrition beverages that you can see through.   Avoid: Any others that contain lactose or fiber. SAMPLE MEAL PLAN Breakfast  4 oz (120 mL) strained orange juice.   to 1 cup (120 to 240 mL) gelatin (plain or fortified).  1 cup (240 mL) beverage (coffee or tea).  Sugar, if desired. Midmorning Snack   cup (120 mL) gelatin (plain or fortified). Lunch  1 cup (240 mL) broth or consomm.  4 oz (120 mL) strained grapefruit juice.   cup (120 mL) gelatin (plain or fortified).  1 cup (240 mL) beverage (coffee or tea).  Sugar, if desired. Midafternoon Snack   cup (120 mL) fruit ice.   cup (120 mL) strained fruit juice. Dinner  1 cup (240 mL) broth or consomm.   cup (120 mL) cranberry juice.   cup (120 mL) flavored gelatin (plain or fortified).  1 cup (240 mL) beverage (coffee or tea).  Sugar, if desired. Evening Snack  4 oz (120 mL) strained apple juice (vitamin C-fortified).   cup (120 mL) flavored gelatin (plain or fortified). MAKE SURE YOU:  Understand these instructions.  Will watch your child's condition.  Will get help right away if your child is not doing well or gets worse. Document Released: 05/30/2005 Document Revised: 01/30/2013 Document Reviewed: 10/30/2012 ExitCare Patient Information 2014 [employer]. Viral   Gastroenteritis Viral gastroenteritis is also known as stomach flu. This condition affects the stomach and intestinal tract. It can cause sudden diarrhea and vomiting. The illness typically lasts 3 to 8 days. Most people develop an immune response that eventually gets rid of the virus. While this natural response develops, the virus can make you quite ill. CAUSES  Many different viruses can cause  gastroenteritis, such as rotavirus or noroviruses. You can catch one of these viruses by consuming contaminated food or water. You may also catch a virus by sharing utensils or other personal items with an infected person or by touching a contaminated surface. SYMPTOMS  The most common symptoms are diarrhea and vomiting. These problems can cause a severe loss of body fluids (dehydration) and a body salt (electrolyte) imbalance. Other symptoms may include:  Fever.  Headache.  Fatigue.  Abdominal pain. DIAGNOSIS  Your caregiver can usually diagnose viral gastroenteritis based on your symptoms and a physical exam. A stool sample may also be taken to test for the presence of viruses or other infections. TREATMENT  This illness typically goes away on its own. Treatments are aimed at rehydration. The most serious cases of viral gastroenteritis involve vomiting so severely that you are not able to keep fluids down. In these cases, fluids must be given through an intravenous line (IV). HOME CARE INSTRUCTIONS   Drink enough fluids to keep your urine clear or pale yellow. Drink small amounts of fluids frequently and increase the amounts as tolerated.  Ask your caregiver for specific rehydration instructions.  Avoid:  Foods high in sugar.  Alcohol.  Carbonated drinks.  Tobacco.  Juice.  Caffeine drinks.  Extremely hot or cold fluids.  Fatty, greasy foods.  Too much intake of anything at one time.  Dairy products until 24 to 48 hours after diarrhea stops.  You may consume probiotics. Probiotics are active cultures of beneficial bacteria. They may lessen the amount and number of diarrheal stools in adults. Probiotics can be found in yogurt with active cultures and in supplements.  Wash your hands well to avoid spreading the virus.  Only take over-the-counter or prescription medicines for pain, discomfort, or fever as directed by your caregiver. Do not give aspirin to children.  Antidiarrheal medicines are not recommended.  Ask your caregiver if you should continue to take your regular prescribed and over-the-counter medicines.  Keep all follow-up appointments as directed by your caregiver. SEEK IMMEDIATE MEDICAL CARE IF:   You are unable to keep fluids down.  You do not urinate at least once every 6 to 8 hours.  You develop shortness of breath.  You notice blood in your stool or vomit. This may look like coffee grounds.  You have abdominal pain that increases or is concentrated in one small area (localized).  You have persistent vomiting or diarrhea.  You have a fever.  The patient is a child younger than 3 months, and he or she has a fever.  The patient is a child older than 3 months, and he or she has a fever and persistent symptoms.  The patient is a child older than 3 months, and he or she has a fever and symptoms suddenly get worse.  The patient is a baby, and he or she has no tears when crying. MAKE SURE YOU:   Understand these instructions.  Will watch your condition.  Will get help right away if you are not doing well or get worse. Document Released: 05/30/2005 Document Revised: 08/22/2011 Document Reviewed: 03/16/2011   ExitCare Patient Information 2014 [place]. Epididymitis Epididymitis is a swelling (inflammation) of the epididymis. The epididymis is a cord-like structure along the back part of the testicle. Epididymitis is usually, but not always, caused by infection. This is usually a sudden problem beginning with chills, fever and pain behind the scrotum and in the testicle. There may be swelling and redness of the testicle. DIAGNOSIS  Physical examination will reveal a tender, swollen epididymis. Sometimes, cultures are obtained from the urine or from prostate secretions to help find out if there is an infection or if the cause is a different problem. Sometimes, blood work is performed to see if your white blood cell count is  elevated and if a germ (bacterial) or viral infection is present. Using this knowledge, an appropriate medicine which kills germs (antibiotic) can be chosen by your caregiver. A viral infection causing epididymitis will most often go away (resolve) without treatment. HOME CARE INSTRUCTIONS   Hot sitz baths for 20 minutes, 4 times per day, may help relieve pain.  Only take over-the-counter or prescription medicines for pain, discomfort or fever as directed by your caregiver.  Take all medicines, including antibiotics, as directed. Take the antibiotics for the full prescribed length of time even if you are feeling better.  It is very important to keep all follow-up appointments. SEEK IMMEDIATE MEDICAL CARE IF:   You have a fever.  You have pain not relieved with medicines.  You have any worsening of your problems.  Your pain seems to come and go.  You develop pain, redness, and swelling in the scrotum and surrounding areas. MAKE SURE YOU:   Understand these instructions.  Will watch your condition.  Will get help right away if you are not doing well or get worse. Document Released: 05/27/2000 Document Revised: 08/22/2011 Document Reviewed: 04/16/2009 [facility] Patient Information 2014 [place], [place].

## 2013-07-19 NOTE — Progress Notes (Signed)
[facility] [address], [place] [address] [phone]  Date:  07/19/2013   Name:  Larry Harper   DOB:  1991/03/31   MRN:  [id number]  PCP:  No PCP Per Patient    Chief Complaint: Diarrhea and Testicle Pain   History of Present Illness:  Larry Harper is a 23 y.o. very pleasant male patient who presents with the following:  Ill 2 days with nausea and no vomiting.  Has watery diarrhea. The patient has no complaint of blood, mucous, or pus in her stools.  No fever or chills.  Says this morning developed pain and tenderness of right testicle.  No rash, discharge, no history of STD.  No dysuria, urgency or frequency.  No improvement with over the counter medications or other home remedies. Denies other complaint or health concern today.   Patient Active Problem List   Diagnosis Date Noted  . S/P appendectomy 02/01/2012    Past Medical History  Diagnosis Date  . HSV-1 (herpes simplex virus 1) infection     Past Surgical History  Procedure Laterality Date  . Tonsillectomy    . Laparoscopic appendectomy  01/12/2012    Procedure: APPENDECTOMY LAPAROSCOPIC;  Surgeon: Shelly Rubensteinouglas A Blackman, MD;  Location: [facility] OR;  Service: General;  Laterality: N/A;  . Appendectomy  12/2011    History  Substance Use Topics  . Smoking status: Current Every Day Smoker -- 3 years    Types: Cigars  . Smokeless tobacco: Not on file  . Alcohol Use: 1.2 oz/week    2 Cans of beer per week    No family history on file.  No Known Allergies  Medication list has been reviewed and updated.  Current Outpatient Prescriptions on File Prior to Visit  Medication Sig Dispense Refill  . benzoyl peroxide 5 % cream Apply topically at bedtime.  113.4 g  0   No current facility-administered medications on file prior to visit.    Review of Systems:  As per HPI, otherwise negative.    Physical Examination: Filed Vitals:   07/19/13 1321  BP: 110/68  Pulse: 86  Temp: 98 F (36.7 C)   Resp: 16   Filed Vitals:   07/19/13 1321  Height: 5' 9.25" (1.759 m)  Weight: 198 lb (89.812 kg)   Body mass index is 29.03 kg/(m^2). Ideal Body Weight: Weight in (lb) to have BMI = 25: 170.2  GEN: WDWN, NAD, Non-toxic, A & O x 3 HEENT: Atraumatic, Normocephalic. Neck supple. No masses, No LAD. Ears and Nose: No external deformity. CV: RRR, No M/G/R. No JVD. No thrill. No extra heart sounds. PULM: CTA B, no wheezes, crackles, rhonchi. No retractions. No resp. distress. No accessory muscle use. ABD: S, NT, ND, +BS. No rebound. No HSM. EXTR: No c/c/e NEURO Normal gait.  PSYCH: Normally interactive. Conversant. Not depressed or anxious appearing.  Calm demeanor.  Genitalia:  Circumcised male.  Left normal teste, cord and epididymis. Right testicle tender not swollen.  Epididymis tender and enlarged and cord normal. No hernia  Assessment and Plan: Gastroenteritis Epididymitis  Signed,  Phillips OdorJeffery Culley Hedeen, MD

## 2013-07-20 LAB — GC/CHLAMYDIA PROBE AMP
CT PROBE, AMP APTIMA: NEGATIVE
GC Probe RNA: NEGATIVE

## 2013-12-30 IMAGING — CT CT ABD-PELV W/ CM
2 of 4 series · 14 of 32 positions shown, 19 images · IV contrast (water/omni  & 100ml omni 300)
Comparison: None.

CLINICAL DATA: Right-sided abdominal pain

CT ABDOMEN AND PELVIS WITH CONTRAST
TECHNIQUE: Multidetector CT imaging of the abdomen and pelvis was
performed following the standard protocol during bolus
administration of intravenous contrast.
Contrast: 100mL OMNIPAQUE IOHEXOL 300 MG/ML  SOLN

[Series 2: routine abdomen · axial · 0.77mm/px · z∈[-496,-116]mm · 8 of 98 slices shown, 13 images]
[im 11/98  soft-tissue]
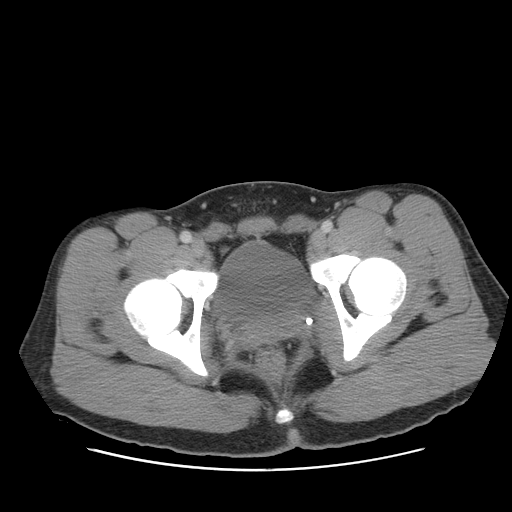
[im 11/98  bone]
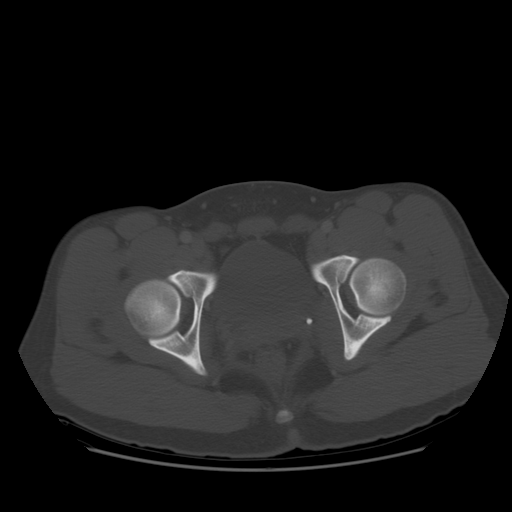
[im 22/98  soft-tissue]
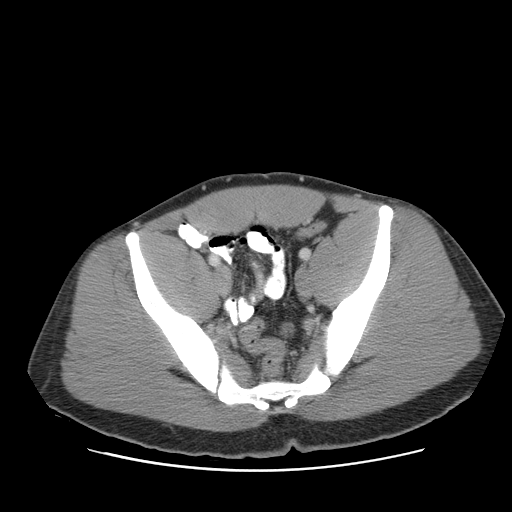
[im 33/98  soft-tissue]
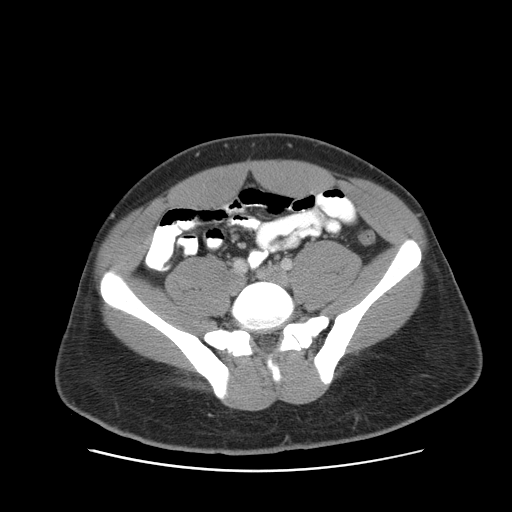
[im 44/98  soft-tissue]
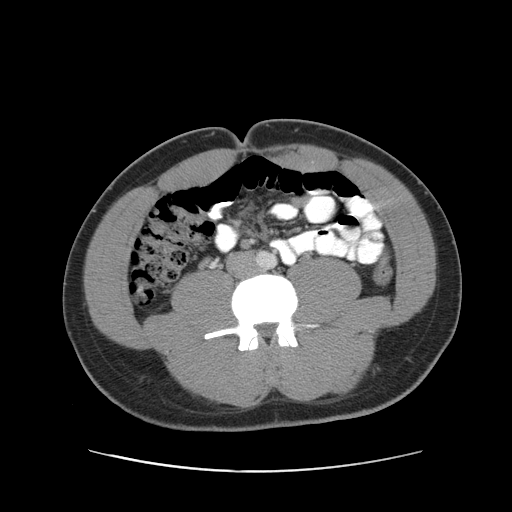
[im 54/98  soft-tissue]
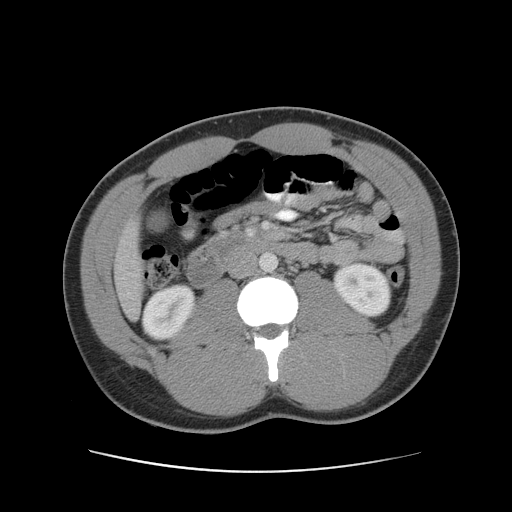
[im 54/98  lung]
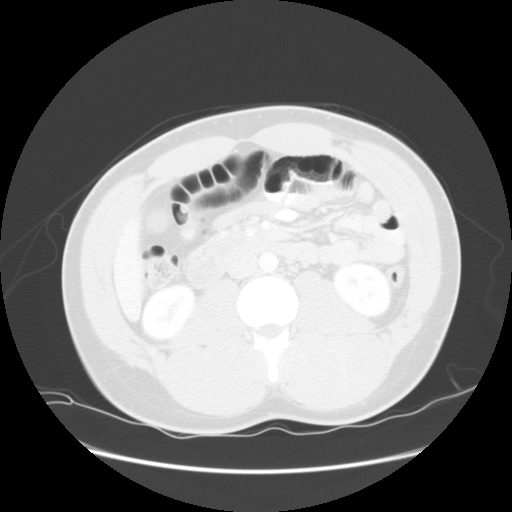
[im 65/98  soft-tissue]
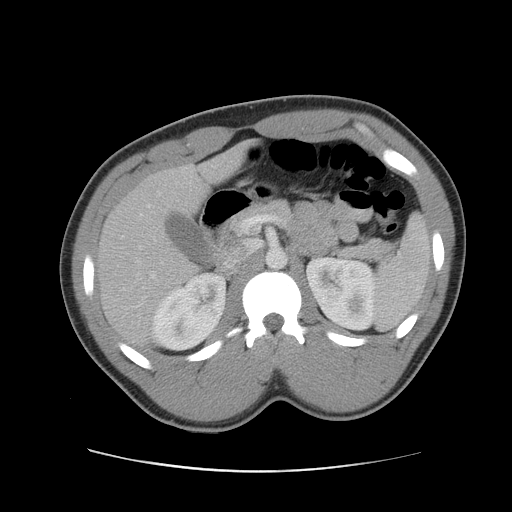
[im 65/98  lung]
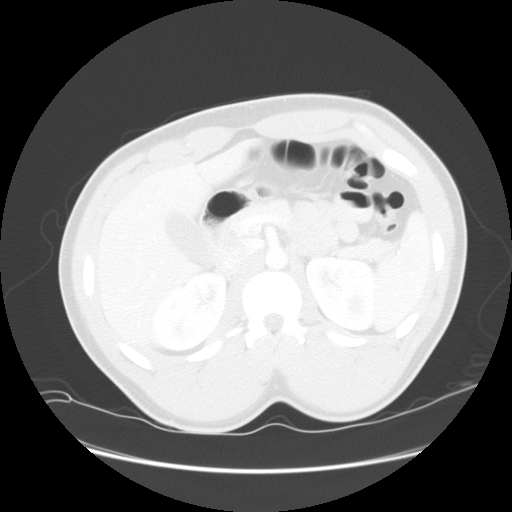
[im 76/98  soft-tissue]
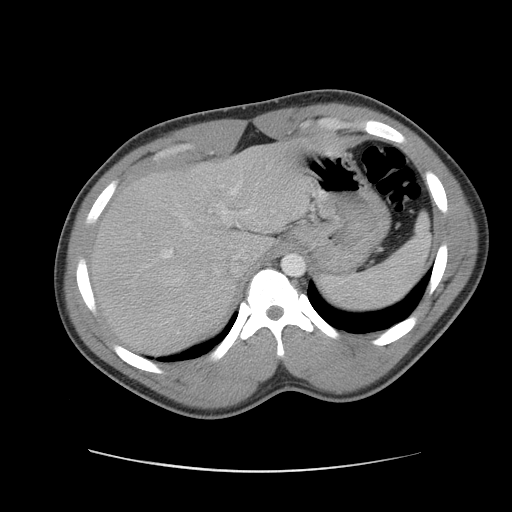
[im 76/98  lung]
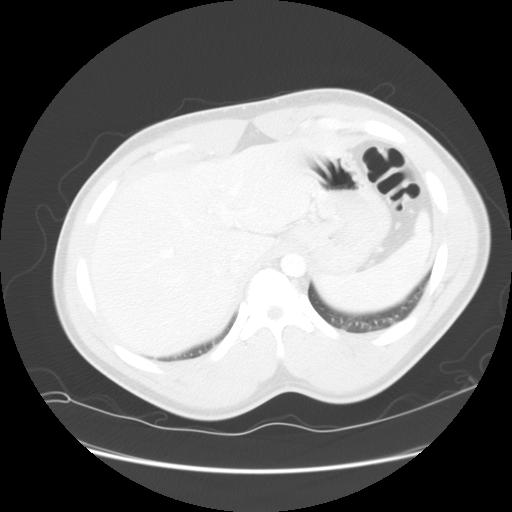
[im 87/98  soft-tissue]
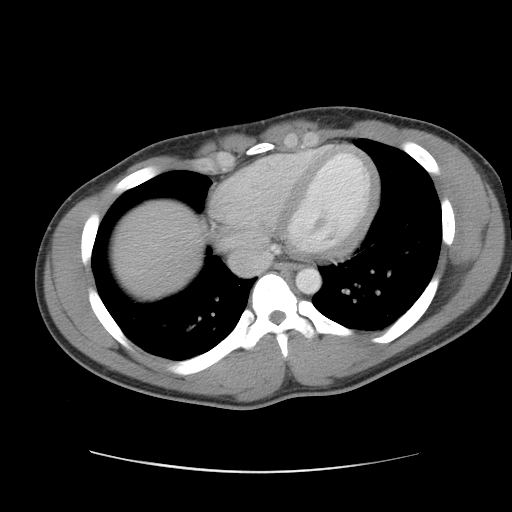
[im 87/98  lung]
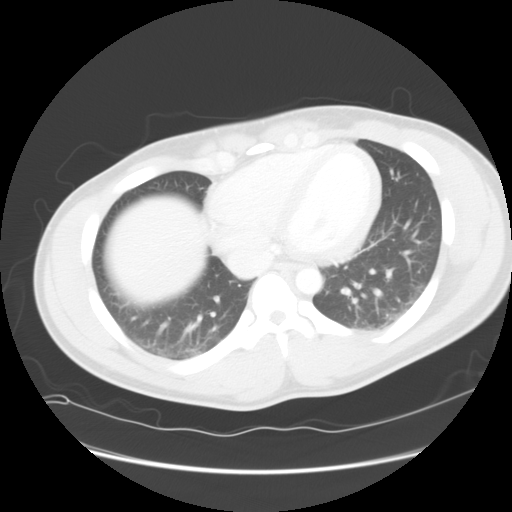

[Series 401: sagittal · sagittal · 0.99mm/px · 6 of 102 slices shown]
[im 11/102  soft-tissue]
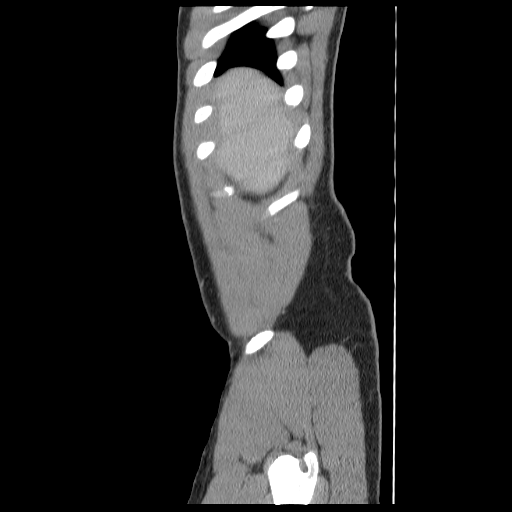
[im 21/102  soft-tissue]
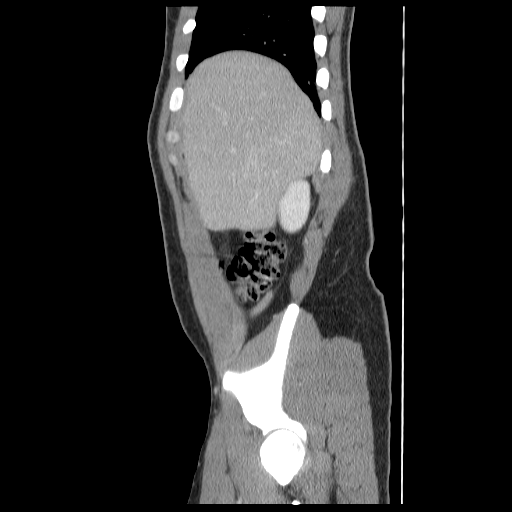
[im 31/102  soft-tissue]
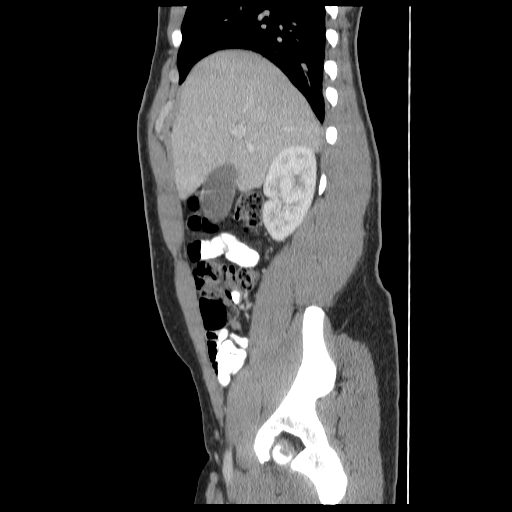
[im 41/102  soft-tissue]
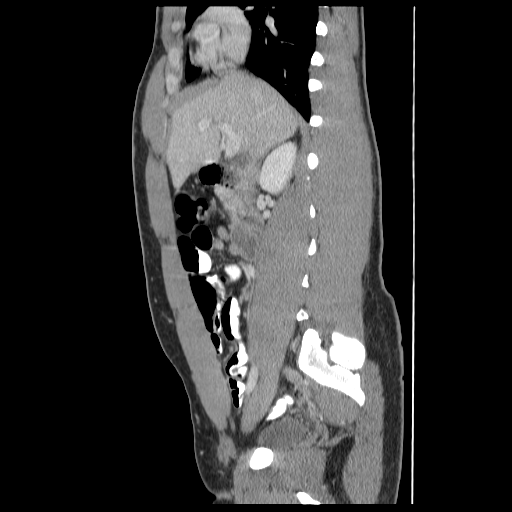
[im 61/102  soft-tissue]
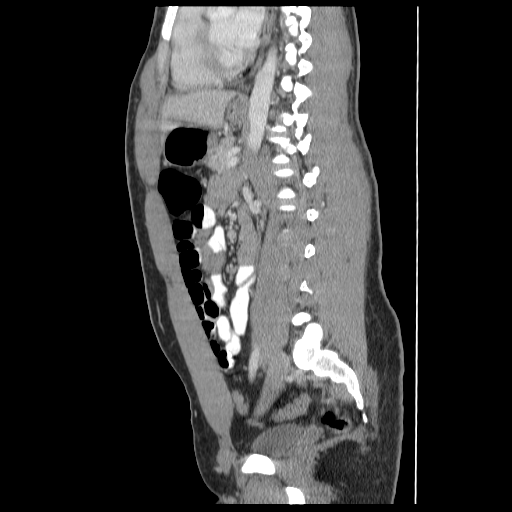
[im 71/102  soft-tissue]
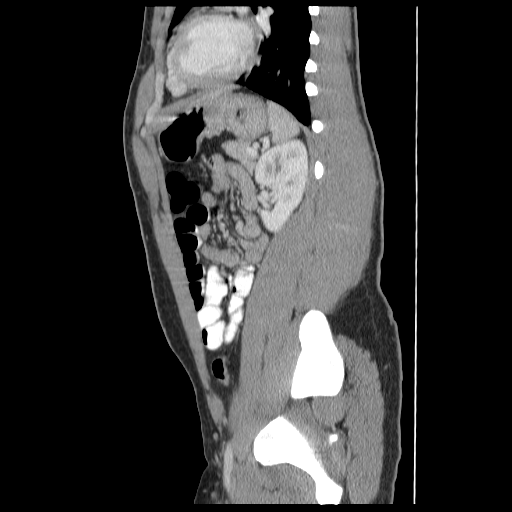

[14 of 32 positions shown; findings below may reference images not displayed]

FINDINGS: Minimal subsegmental dependent atelectasis.  Otherwise
lung bases are clear.  Normal heart size.  No pleural or
pericardial effusion.

Unremarkable liver, biliary system, spleen, pancreas, adrenal
glands, and kidneys.  No hydronephrosis or hydroureter.

The appendix measures 9 mm in diameter.  There is mucosal
hyperenhancement and periappendiceal fat stranding.  No free
intraperitoneal air or loculated fluid.  There is a small amount of
dependent fluid within the pelvis.

No bowel obstruction.  No CT evidence for colitis.  No
lymphadenopathy.  Reactive ileocolic lymph nodes.

Normal caliber vasculature.

Partially decompressed, thin-walled bladder.

No acute osseous finding.
IMPRESSION: Acute appendicitis.
